# Patient Record
Sex: Male | Born: 2000 | Race: White | Hispanic: No | Marital: Single | Smoking: Never smoker
Health system: Southern US, Community
[De-identification: ages and names within clinical notes are randomized; demographics above are authoritative.]

## PROBLEM LIST (undated history)

## (undated) DIAGNOSIS — E669 Obesity, unspecified: Secondary | ICD-10-CM

## (undated) DIAGNOSIS — K219 Gastro-esophageal reflux disease without esophagitis: Secondary | ICD-10-CM

## (undated) DIAGNOSIS — I10 Essential (primary) hypertension: Secondary | ICD-10-CM

## (undated) HISTORY — DX: Essential (primary) hypertension: I10

## (undated) HISTORY — DX: Obesity, unspecified: E66.9

## (undated) HISTORY — DX: Gastro-esophageal reflux disease without esophagitis: K21.9

---
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
## 2021-02-09 ENCOUNTER — Other Ambulatory Visit: Payer: Self-pay

## 2021-02-09 ENCOUNTER — Ambulatory Visit (INDEPENDENT_AMBULATORY_CARE_PROVIDER_SITE_OTHER): Payer: Self-pay | Admitting: Pediatrics

## 2021-02-09 ENCOUNTER — Telehealth: Payer: Self-pay

## 2021-02-09 VITALS — Wt 280.0 lb

## 2021-02-09 DIAGNOSIS — H6692 Otitis media, unspecified, left ear: Secondary | ICD-10-CM

## 2021-02-09 MED ORDER — AMOXICILLIN-POT CLAVULANATE 875-125 MG PO TABS: 1.0000 | ORAL_TABLET | Freq: Two times a day (BID) | ORAL | 0 refills | Status: AC

## 2021-02-09 NOTE — Telephone Encounter (Signed)
Per patient having sharp ear pain, wanted to see if yall would approve a double booking, patient has not signed a release since last well was canceled. I advised patient that he will have to find another primary care.

## 2021-02-09 NOTE — Progress Notes (Signed)
Subjective:     James Sandoval is a 20 y.o. male who presents with ear pain and possible ear infection. Symptoms include: left ear pain. Onset of symptoms was 4 days ago, and have been unchanged since that time. Associated symptoms include: headache.  Patient denies: chills, fever , productive cough and sore throat. He is drinking plenty of fluids.  The following portions of the patient's history were reviewed and updated as appropriate: allergies, current medications, past medical history, past social history and problem list.  Review of Systems Pertinent items are noted in HPI.   Objective:    Wt 280 lb (127 kg)  General:  alert and cooperative  Right Ear: diminished mobility  Left Ear: red, inflamed and bulging   Mouth:  lips, mucosa, and tongue normal; teeth and gums normal     Assessment:    Left acute otitis media   Plan:    Treatment: Augmentin. OTC analgesia as needed. Fluids, rest, avoid carbonated/alcoholic and caffeinated beverages.  Follow up in only as needed  in the future if not improving.

## 2021-02-09 NOTE — Telephone Encounter (Signed)
I will see him 

## 2021-05-25 ENCOUNTER — Encounter: Payer: Self-pay | Admitting: Pediatrics

## 2021-06-20 ENCOUNTER — Other Ambulatory Visit: Payer: Self-pay

## 2021-06-20 ENCOUNTER — Emergency Department (HOSPITAL_COMMUNITY)
Admission: EM | Admit: 2021-06-20 | Discharge: 2021-06-20 | Disposition: A | Discharge status: Home / Self Care | Payer: 59 | Attending: Emergency Medicine | Admitting: Emergency Medicine

## 2021-06-20 ENCOUNTER — Encounter (HOSPITAL_COMMUNITY): Payer: Self-pay | Admitting: *Deleted

## 2021-06-20 DIAGNOSIS — J45901 Unspecified asthma with (acute) exacerbation: Secondary | ICD-10-CM | POA: Insufficient documentation

## 2021-06-20 DIAGNOSIS — K0889 Other specified disorders of teeth and supporting structures: Secondary | ICD-10-CM | POA: Insufficient documentation

## 2021-06-20 MED ORDER — PENICILLIN V POTASSIUM 500 MG PO TABS: 500.0000 mg | ORAL_TABLET | Freq: Four times a day (QID) | ORAL | 0 refills | Status: DC

## 2021-06-20 MED ORDER — PENICILLIN V POTASSIUM 500 MG PO TABS: 500.0000 mg | ORAL_TABLET | Freq: Four times a day (QID) | ORAL | 0 refills | Status: AC

## 2021-06-20 NOTE — ED Triage Notes (Signed)
Pt with right upper dental pain since yesterday.

## 2021-06-20 NOTE — ED Provider Notes (Signed)
[facility] EMERGENCY DEPARTMENT Provider Note   CSN: [id number] Arrival date & time: 06/20/21  1641     History Chief Complaint  Patient presents with   Dental Pain    James Sandoval is a 20 y.o. male.  HPI  Patient with significant medical history of GERD, obesity presents to the emergency department chief complaint of dental pain.  Patient states that started approximately 2 days ago, pain came on suddenly, pain in his right upper molar, states is a constant pain but will wax and wane in intensity, worsened with chewing, denies increased sensitivity, has no recent trauma to the area, states he has not seen a dentist in a long time.  He does note that he has a filling in that tooth with fell out and thinks this is what is causing him his pain.  He denies trismus, torticollis, denies fevers, chills, is not immunocompromise, has no other complaints this time.  He does not endorse tongue or throat swelling, difficulty swallowing, difficulty breathing or chest pain.   Past Medical History:  Diagnosis Date   Asthma    BP (high blood pressure) 03/23/2016   GERD (gastroesophageal reflux disease) 11/27/2013   Obesity, unspecified 05/28/2013    Patient Active Problem List   Diagnosis Date Noted   BP (high blood pressure) 03/23/2016   BMI, pediatric > 99% for age 49/26/2017   AN (acanthosis nigricans) 03/10/2016   Elevated blood pressure 03/10/2016   GERD (gastroesophageal reflux disease) 11/27/2013   Asthma with acute exacerbation 02/23/2013   Seasonal allergies 02/23/2013   Dysfluency 09/23/2011    History reviewed. No pertinent surgical history.     Family History  Problem Relation Age of Onset   Diabetes Other    Hypertension Mother    Hypertension Maternal Grandmother    Hypertension Maternal Grandfather    Diabetes Maternal Grandfather     Social History   Tobacco Use   Smoking status: Never   Smokeless tobacco: Never  Substance Use Topics   Alcohol use: No    Drug use: No    Home Medications Prior to Admission medications   Medication Sig Start Date End Date Taking? Authorizing Provider  penicillin v potassium (VEETID) 500 MG tablet Take 1 tablet (500 mg total) by mouth 4 (four) times daily for 7 days. 06/20/21 06/27/21 Yes Carroll Sage, PA-C  Cetirizine HCl (ZYRTEC PO) Take 1 tablet by mouth daily as needed. For allergy symptoms    [provider]  naproxen sodium (ANAPROX) 220 MG tablet Take by mouth.    [provider]    Allergies    Patient has no known allergies.  Review of Systems   Review of Systems  Constitutional:  Negative for chills and fever.  HENT:  Positive for dental problem. Negative for congestion, drooling, ear discharge, facial swelling, sore throat and trouble swallowing.   Respiratory:  Negative for shortness of breath.   Cardiovascular:  Negative for chest pain.  Gastrointestinal:  Negative for abdominal pain.  Musculoskeletal:  Negative for back pain.  Skin:  Negative for rash.  Neurological:  Negative for headaches.   Physical Exam Updated Vital Signs BP (!) 164/95 (BP Location: Right Arm)   Pulse 83   Temp 98.4 F (36.9 C) (Oral)   Resp 16   Ht 5' 10.5" (1.791 m)   Wt 123.4 kg   SpO2 99%   BMI 38.48 kg/m   Physical Exam Vitals and nursing note reviewed.  Constitutional:  General: He is not in acute distress.    Appearance: He is not ill-appearing.  HENT:     Head: Normocephalic and atraumatic.     Nose: No congestion.     Mouth/Throat:     Comments: Oropharynx is visualized tongue uvula are both midline, controlling oral secretions, his right upper molar has a dental cavity, no surrounding erythema or edema noted within the gumline, no fluctuant indurations present.  No trismus or torticollis. Eyes:     Extraocular Movements: Extraocular movements intact.     Conjunctiva/sclera: Conjunctivae normal.  Cardiovascular:     Rate and Rhythm: Normal rate and regular rhythm.   Pulmonary:     Effort: Pulmonary effort is normal.  Skin:    General: Skin is warm and dry.  Neurological:     Mental Status: He is alert.  Psychiatric:        Mood and Affect: Mood normal.    ED Results / Procedures / Treatments   Labs (all labs ordered are listed, but only abnormal results are displayed) Labs Reviewed - No data to display  EKG None  Radiology No results found.  Procedures Procedures   Medications Ordered in ED Medications - No data to display  ED Course  I have reviewed the triage vital signs and the nursing notes.  Pertinent labs & imaging results that were available during my care of the patient were reviewed by me and considered in my medical decision making (see chart for details).    MDM Rules/Calculators/A&P                          Initial impression-presents with a dental cavity.  He is alert, does not appear to be in acute stress, vital signs reassuring.  Work-up-due to well-appearing patient, benign physical exam, further lab or imaging not want at this time.  Rule out- I have low suspicion for peritonsillar abscess, retropharyngeal abscess, or Ludwig angina as oropharynx was visualized tongue and uvula were both midline, there is no exudates, erythema or edema noted in the posterior pillars or on/ around tonsils.  Low suspicion for an abscess as gumline were palpated no fluctuance or induration felt.  Low suspicion for periorbital or orbital cellulitis as patient face had no erythematous, patient EOMs were intact, he had no pain with eye movement.   Plan-  Dental pain-suspect dental cavity will start him on antibiotics, have him follow-up with a dentist for further evaluation.  Vital signs have remained stable, no indication for hospital admission. Patient given at home care as well strict return precautions.  Patient verbalized that they understood agreed to said plan.  Final Clinical Impression(s) / ED Diagnoses Final diagnoses:   Pain, dental    Rx / DC Orders ED Discharge Orders          Ordered    penicillin v potassium (VEETID) 500 MG tablet  4 times daily        06/20/21 1823             Barnie Del 06/20/21 Erling Conte, MD 06/21/21 [id number]

## 2021-06-20 NOTE — Discharge Instructions (Addendum)
Suspect to have a dental cavity, started on antibiotics please take as prescribed.  Recommend over-the-counter pain medications  Please follow-up with dentist for further evaluation.  Come back to the emergency department if you develop chest pain, shortness of breath, severe abdominal pain, uncontrolled nausea, vomiting, diarrhea.

## 2021-10-01 ENCOUNTER — Telehealth: Payer: Self-pay | Admitting: Internal Medicine

## 2021-10-01 ENCOUNTER — Encounter (HOSPITAL_COMMUNITY): Payer: Self-pay | Admitting: *Deleted

## 2021-10-01 ENCOUNTER — Emergency Department (HOSPITAL_COMMUNITY)
Admission: EM | Admit: 2021-10-01 | Discharge: 2021-10-01 | Disposition: A | Discharge status: Home / Self Care | Payer: 59 | Attending: Emergency Medicine | Admitting: Emergency Medicine

## 2021-10-01 DIAGNOSIS — R000 Tachycardia, unspecified: Secondary | ICD-10-CM | POA: Insufficient documentation

## 2021-10-01 DIAGNOSIS — K529 Noninfective gastroenteritis and colitis, unspecified: Secondary | ICD-10-CM | POA: Insufficient documentation

## 2021-10-01 DIAGNOSIS — J45901 Unspecified asthma with (acute) exacerbation: Secondary | ICD-10-CM | POA: Insufficient documentation

## 2021-10-01 DIAGNOSIS — R1110 Vomiting, unspecified: Secondary | ICD-10-CM | POA: Diagnosis present

## 2021-10-01 DIAGNOSIS — E876 Hypokalemia: Secondary | ICD-10-CM | POA: Insufficient documentation

## 2021-10-01 LAB — CBC WITH DIFFERENTIAL/PLATELET
Abs Immature Granulocytes: 0.11 10*3/uL — ABNORMAL HIGH (ref 0.00–0.07)
Basophils Absolute: 0.1 10*3/uL (ref 0.0–0.1)
Basophils Relative: 1 %
Eosinophils Absolute: 0.6 10*3/uL — ABNORMAL HIGH (ref 0.0–0.5)
Eosinophils Relative: 5 %
HCT: 46.1 % (ref 39.0–52.0)
Hemoglobin: 15.1 g/dL (ref 13.0–17.0)
Immature Granulocytes: 1 %
Lymphocytes Relative: 15 %
Lymphs Abs: 1.7 10*3/uL (ref 0.7–4.0)
MCH: 27.3 pg (ref 26.0–34.0)
MCHC: 32.8 g/dL (ref 30.0–36.0)
MCV: 83.4 fL (ref 80.0–100.0)
Monocytes Absolute: 2 10*3/uL — ABNORMAL HIGH (ref 0.1–1.0)
Monocytes Relative: 18 %
Neutro Abs: 6.7 10*3/uL (ref 1.7–7.7)
Neutrophils Relative %: 60 %
Platelets: 310 10*3/uL (ref 150–400)
RBC: 5.53 MIL/uL (ref 4.22–5.81)
RDW: 14.6 % (ref 11.5–15.5)
WBC: 11.2 10*3/uL — ABNORMAL HIGH (ref 4.0–10.5)
nRBC: 0 % (ref 0.0–0.2)

## 2021-10-01 LAB — COMPREHENSIVE METABOLIC PANEL
ALT: 17 U/L (ref 0–44)
AST: 15 U/L (ref 15–41)
Albumin: 4.2 g/dL (ref 3.5–5.0)
Alkaline Phosphatase: 66 U/L (ref 38–126)
Anion gap: 12 (ref 5–15)
BUN: 5 mg/dL — ABNORMAL LOW (ref 6–20)
CO2: 25 mmol/L (ref 22–32)
Calcium: 9 mg/dL (ref 8.9–10.3)
Chloride: 99 mmol/L (ref 98–111)
Creatinine, Ser: 0.75 mg/dL (ref 0.61–1.24)
GFR, Estimated: >60 mL/min (ref >60)
Glucose, Bld: 97 mg/dL (ref 70–99)
Potassium: 3.3 mmol/L — ABNORMAL LOW (ref 3.5–5.1)
Sodium: 136 mmol/L (ref 135–145)
Total Bilirubin: 0.8 mg/dL (ref 0.3–1.2)
Total Protein: 7.7 g/dL (ref 6.5–8.1)

## 2021-10-01 LAB — LIPASE, BLOOD: Lipase: 19 U/L (ref 11–51)

## 2021-10-01 MED ORDER — POTASSIUM CHLORIDE CRYS ER 20 MEQ PO TBCR
40.0000 meq | EXTENDED_RELEASE_TABLET | Freq: Once | ORAL | Status: AC
  Administered 2021-10-01: 19:00:00 40 meq via ORAL
  Filled 2021-10-01: qty 2

## 2021-10-01 MED ORDER — ONDANSETRON 4 MG PO TBDP: 4.0000 mg | ORAL_TABLET | Freq: Three times a day (TID) | ORAL | 0 refills | Status: AC | PRN

## 2021-10-01 MED ORDER — LACTATED RINGERS IV BOLUS
1000.0000 mL | Freq: Once | INTRAVENOUS | Status: AC
  Administered 2021-10-01: 18:00:00 1000 mL via INTRAVENOUS

## 2021-10-01 MED ORDER — POTASSIUM CHLORIDE CRYS ER 20 MEQ PO TBCR: 20.0000 meq | EXTENDED_RELEASE_TABLET | Freq: Every day | ORAL | 0 refills | Status: AC

## 2021-10-01 MED ORDER — ACETAMINOPHEN 500 MG PO TABS
1000.0000 mg | ORAL_TABLET | Freq: Once | ORAL | Status: AC
  Administered 2021-10-01: 18:00:00 1000 mg via ORAL
  Filled 2021-10-01: qty 2

## 2021-10-01 MED ORDER — LOPERAMIDE HCL 2 MG PO CAPS: 2.0000 mg | ORAL_CAPSULE | Freq: Four times a day (QID) | ORAL | 0 refills | Status: AC | PRN

## 2021-10-01 NOTE — Discharge Instructions (Addendum)
If you develop worsening, continued, or recurrent abdominal pain, uncontrolled vomiting, fever, chest or back pain, if your bloody diarrhea worsens, or any other new/concerning symptoms then return to the ER for evaluation.   The gastroenterologist listed will call you for an appointment.

## 2021-10-01 NOTE — Telephone Encounter (Signed)
ER contact me about this patient.  Can we get an urgent appointment in the clinic with either an app or myself for rectal bleeding and diarrhea?  Okay to use one of my urgent spots if needed.  Thank you

## 2021-10-01 NOTE — ED Triage Notes (Signed)
Vomiting for a week intermittent, also c/o abdominal pain

## 2021-10-01 NOTE — ED Provider Notes (Signed)
[facility] EMERGENCY DEPARTMENT Provider Note   CSN: [id number] Arrival date & time: 10/01/21  1613     History Chief Complaint  Patient presents with   Emesis    James Sandoval is a 20 y.o. male.  HPI 20 year old male presents with vomiting, diarrhea, and abdominal pain.  He has been dealing with symptoms for about a week.  He has abdominal pain about an hour after eating that is diffuse across his lower abdomen.  He is also been having some vomiting an hour after eating that is just spit.  Started off by having diarrhea but now this is changed into "bloody" stools.  He has been drinking a lot of red drinks and wondering if that is doing it as it has done this in the past.  He is not sure if is really blood or just red from diet.  Right now he has no symptoms.  He thinks he has been having a fever but has not checked.  No recent traveling anywhere. No sick contacts.  Past Medical History:  Diagnosis Date   Asthma    BP (high blood pressure) 03/23/2016   GERD (gastroesophageal reflux disease) 11/27/2013   Obesity, unspecified 05/28/2013    Patient Active Problem List   Diagnosis Date Noted   BP (high blood pressure) 03/23/2016   BMI, pediatric > 99% for age 67/26/2017   AN (acanthosis nigricans) 03/10/2016   Elevated blood pressure 03/10/2016   GERD (gastroesophageal reflux disease) 11/27/2013   Asthma with acute exacerbation 02/23/2013   Seasonal allergies 02/23/2013   Dysfluency 09/23/2011    History reviewed. No pertinent surgical history.     Family History  Problem Relation Age of Onset   Diabetes Other    Hypertension Mother    Hypertension Maternal Grandmother    Hypertension Maternal Grandfather    Diabetes Maternal Grandfather     Social History   Tobacco Use   Smoking status: Never   Smokeless tobacco: Never  Substance Use Topics   Alcohol use: No   Drug use: No    Home Medications Prior to Admission medications   Medication Sig Start Date  End Date Taking? Authorizing Provider  loperamide (IMODIUM) 2 MG capsule Take 1 capsule (2 mg total) by mouth 4 (four) times daily as needed for diarrhea or loose stools. 10/01/21  Yes Pricilla Loveless, MD  ondansetron (ZOFRAN ODT) 4 MG disintegrating tablet Take 1 tablet (4 mg total) by mouth every 8 (eight) hours as needed for nausea or vomiting. 10/01/21  Yes Pricilla Loveless, MD  potassium chloride SA (KLOR-CON) 20 MEQ tablet Take 1 tablet (20 mEq total) by mouth daily. 10/01/21  Yes Pricilla Loveless, MD  Cetirizine HCl (ZYRTEC PO) Take 1 tablet by mouth daily as needed. For allergy symptoms    [provider]  naproxen sodium (ANAPROX) 220 MG tablet Take by mouth.    [provider]    Allergies    Patient has no known allergies.  Review of Systems   Review of Systems  Constitutional:  Positive for fever.  Gastrointestinal:  Positive for abdominal pain, blood in stool, diarrhea and vomiting.  All other systems reviewed and are negative.  Physical Exam Updated Vital Signs BP (!) 144/85   Pulse 93   Temp 99.9 F (37.7 C)   Resp 19   SpO2 99%   Physical Exam Vitals and nursing note reviewed.  Constitutional:      General: He is not in acute distress.  Appearance: He is well-developed. He is obese. He is not ill-appearing or diaphoretic.  HENT:     Head: Normocephalic and atraumatic.     Right Ear: External ear normal.     Left Ear: External ear normal.     Nose: Nose normal.  Eyes:     General:        Right eye: No discharge.        Left eye: No discharge.  Cardiovascular:     Rate and Rhythm: Regular rhythm. Tachycardia present.     Heart sounds: Normal heart sounds.  Pulmonary:     Effort: Pulmonary effort is normal.     Breath sounds: Normal breath sounds.  Abdominal:     Palpations: Abdomen is soft.     Tenderness: There is no abdominal tenderness.  Musculoskeletal:     Cervical back: Neck supple.  Skin:    General: Skin is warm and dry.   Neurological:     Mental Status: He is alert.  Psychiatric:        Mood and Affect: Mood is not anxious.    ED Results / Procedures / Treatments   Labs (all labs ordered are listed, but only abnormal results are displayed) Labs Reviewed  COMPREHENSIVE METABOLIC PANEL - Abnormal; Notable for the following components:      Result Value   Potassium 3.3 (*)    BUN 5 (*)    All other components within normal limits  CBC WITH DIFFERENTIAL/PLATELET - Abnormal; Notable for the following components:   WBC 11.2 (*)    Monocytes Absolute 2.0 (*)    Eosinophils Absolute 0.6 (*)    Abs Immature Granulocytes 0.11 (*)    All other components within normal limits  GASTROINTESTINAL PANEL BY PCR, STOOL (REPLACES STOOL CULTURE)  LIPASE, BLOOD  POC OCCULT BLOOD, ED    EKG None  Radiology No results found.  Procedures Procedures   Medications Ordered in ED Medications  potassium chloride SA (KLOR-CON) CR tablet 40 mEq (has no administration in time range)  lactated ringers bolus 1,000 mL (0 mLs Intravenous Stopped 10/01/21 1847)  acetaminophen (TYLENOL) tablet 1,000 mg (1,000 mg Oral Given 10/01/21 1801)    ED Course  I have reviewed the triage vital signs and the nursing notes.  Pertinent labs & imaging results that were available during my care of the patient were reviewed by me and considered in my medical decision making (see chart for details).    MDM Rules/Calculators/A&P                           Patient's abdominal exam is benign.  On rectal exam there is a slight amount of pink that is Hemoccult positive.  Discussed with Dr. Marletta Lor.  Given otherwise well appearance would hold off on antibiotics in case this is enterogenic E. coli.  Would still be okay to do Imodium.  Given has had a prior history of having some blood or red in his stools, Dr. Marletta Lor will follow-up as an outpatient.  Otherwise, patient's abdominal exam is benign and I do not think a CT will be helpful.   Discussed supportive care.  Will be given return precautions. Final Clinical Impression(s) / ED Diagnoses Final diagnoses:  Gastroenteritis  Hypokalemia    Rx / DC Orders ED Discharge Orders          Ordered    ondansetron (ZOFRAN ODT) 4 MG disintegrating tablet  Every 8 hours PRN  10/01/21 1911    potassium chloride SA (KLOR-CON) 20 MEQ tablet  Daily        10/01/21 1911    loperamide (IMODIUM) 2 MG capsule  4 times daily PRN        10/01/21 1911             Pricilla Loveless, MD 10/01/21 1914

## 2021-10-02 LAB — POC OCCULT BLOOD, ED: Fecal Occult Bld: POSITIVE — AB

## 2021-10-05 ENCOUNTER — Other Ambulatory Visit: Payer: Self-pay

## 2021-10-05 ENCOUNTER — Encounter: Payer: Self-pay | Admitting: Gastroenterology

## 2021-10-05 ENCOUNTER — Ambulatory Visit: Payer: 59 | Admitting: Gastroenterology

## 2021-10-05 DIAGNOSIS — R197 Diarrhea, unspecified: Secondary | ICD-10-CM

## 2021-10-05 NOTE — Patient Instructions (Signed)
Please collect stool as soon as you can for infection. We will be in touch with results as available. If you continue to have diarrhea or blood in the stool, please call us at [phone]. You will need to have a colonoscopy if this happens.  Complete potassium.

## 2021-10-05 NOTE — Progress Notes (Signed)
Primary Care Physician:  Pcp, No  Primary Gastroenterologist:  Elon Alas. Abbey Chatters, DO   Chief Complaint  Patient presents with   Rectal Bleeding    Seen in ED   Diarrhea     HPI:  James Sandoval is a 20 y.o. male here for further evaluation of abdominal pain, vomiting, diarrhea, rectal bleeding.  Patient seen in the ED on November 17 with the above symptoms.  Symptoms had been occurring for about 1 week.  Patient states symptoms started after eating out at fast food restaurant (chicken) earlier in the day.  Thought he may have food poisoning.  Mother and father also ate without any symptoms.  Developed lower abdominal cramping type pain associated with diarrhea.  He did have several episodes of dry heaves.  Unable to eat for about a week.  Eventually developed blood in the stool.  At first she thought it was from the red drinks he was drinking.  Felt weak and dehydrated so went to the ED on November 17.  In the ED visit, abdominal exam benign.  Rectal exam showed a slight amount of pink that was heme positive.  ED provider contacted Dr. Abbey Chatters who recommended collecting stool studies, holding off antibiotics following up this week for an office visit.  Labs showed potassium of 3.3, white blood cell count 11,200.  Patient states he did not collect stool studies.  He has 1 more dose of potassium.  Has not needed Zofran or Imodium.  Feels like stool frequency has improved significantly but stools remain "milkshake consistency".  Last stool last night was still bloody.  Noted dark blood and fresh blood.  He has not had a bowel movement this morning.  His lower abdominal pain has improved significantly.  He is now able to eat.  Denies any rectal pain.  States he feels so much better.  Generally his stools are pretty regular, daily or every other day.  Really denies any significant chronic diarrhea.  He has seen blood in the stools once in the past.  No family history of colon cancer,  IBD.   Current Outpatient Medications  Medication Sig Dispense Refill   loperamide (IMODIUM) 2 MG capsule Take 1 capsule (2 mg total) by mouth 4 (four) times daily as needed for diarrhea or loose stools. 12 capsule 0   ondansetron (ZOFRAN ODT) 4 MG disintegrating tablet Take 1 tablet (4 mg total) by mouth every 8 (eight) hours as needed for nausea or vomiting. 10 tablet 0   potassium chloride SA (KLOR-CON) 20 MEQ tablet Take 1 tablet (20 mEq total) by mouth daily. 3 tablet 0   No current facility-administered medications for this visit.    Allergies as of 10/05/2021   (No Known Allergies)    Past Medical History:  Diagnosis Date   Asthma    BP (high blood pressure) 03/23/2016   GERD (gastroesophageal reflux disease) 11/27/2013   Obesity, unspecified 05/28/2013    History reviewed. No pertinent surgical history.  Family History  Problem Relation Age of Onset   Hypertension Mother    Hypertension Maternal Grandmother    Hypertension Maternal Grandfather    Diabetes Maternal Grandfather    Diabetes Other    Colon cancer Neg Hx    Inflammatory bowel disease Neg Hx     Social History   Socioeconomic History   Marital status: Single    Spouse name: Not on file   Number of children: Not on file   Years of education: Not on  file   Highest education level: Not on file  Occupational History   Not on file  Tobacco Use   Smoking status: Never   Smokeless tobacco: Never  Substance and Sexual Activity   Alcohol use: No   Drug use: No   Sexual activity: Not on file  Other Topics Concern   Not on file  Social History Narrative   Lives with mother   Social Determinants of Health   Financial Resource Strain: Not on file  Food Insecurity: Not on file  Transportation Needs: Not on file  Physical Activity: Not on file  Stress: Not on file  Social Connections: Not on file  Intimate Partner Violence: Not on file      ROS:  General: Negative for anorexia, weight loss,  fever, chills, fatigue, weakness.  See HPI. Eyes: Negative for vision changes.  ENT: Negative for hoarseness, difficulty swallowing , nasal congestion. CV: Negative for chest pain, angina, palpitations, dyspnea on exertion, peripheral edema.  Respiratory: Negative for dyspnea at rest, dyspnea on exertion, cough, sputum, wheezing.  GI: See history of present illness. GU:  Negative for dysuria, hematuria, urinary incontinence, urinary frequency, nocturnal urination.  MS: Negative for joint pain, low back pain.  Derm: Negative for rash or itching.  Neuro: Negative for weakness, abnormal sensation, seizure, frequent headaches, memory loss, confusion.  Psych: Negative for anxiety, depression, suicidal ideation, hallucinations.  Endo: Negative for unusual weight change.  Heme: Negative for bruising or bleeding. Allergy: Negative for rash or hives.    Physical Examination:  BP 135/82   Pulse (!) 101   Temp (!) 97.5 F (36.4 C) (Temporal)   Ht 5' 10.5" (1.791 m)   Wt 267 lb 12.8 oz (121.5 kg)   BMI 37.88 kg/m    General: Well-nourished, well-developed in no acute distress.  Head: Normocephalic, atraumatic.   Eyes: Conjunctiva pink, no icterus. Mouth: masked Neck: Supple without thyromegaly, masses, or lymphadenopathy.  Lungs: Clear to auscultation bilaterally.  Heart: Regular rate and rhythm, no murmurs rubs or gallops.  Abdomen: Bowel sounds are normal, nontender, nondistended, no hepatosplenomegaly or masses, no abdominal bruits or    hernia , no rebound or guarding.   Rectal: not performed Extremities: No lower extremity edema. No clubbing or deformities.  Neuro: Alert and oriented x 4 , grossly normal neurologically.  Skin: Warm and dry, no rash or jaundice.   Psych: Alert and cooperative, normal mood and affect.  Labs: Lab Results  Component Value Date   CREATININE 0.75 10/01/2021   BUN 5 (L) 10/01/2021   NA 136 10/01/2021   K 3.3 (L) 10/01/2021   CL 99 10/01/2021   CO2  25 10/01/2021   Lab Results  Component Value Date   ALT 17 10/01/2021   AST 15 10/01/2021   ALKPHOS 66 10/01/2021   BILITOT 0.8 10/01/2021   Lab Results  Component Value Date   WBC 11.2 (H) 10/01/2021   HGB 15.1 10/01/2021   HCT 46.1 10/01/2021   MCV 83.4 10/01/2021   PLT 310 10/01/2021   Lab Results  Component Value Date   LIPASE 19 10/01/2021     Imaging Studies: No results found.   Assessment/Plan:  Acute onset nausea/vomiting, diarrhea, blood in the stool: Likely infectious process, possibly foodborne.  Patient denies any significant chronic symptoms.  Has seen blood in the stool once prior to recent illness.  Continues to improve.  Stool frequency better.  Still having loose bloody stools.  We will have him complete stool studies.  This may help determine if any additional work-up needed.  Hold off on antibiotics given he is better.  Complete potassium supplement.  If he continues to have loose stools or blood in the stools, he may require colonoscopy to rule out inflammatory bowel disease.  He will keep [place] posted.

## 2021-10-11 LAB — GI PROFILE, STOOL, PCR

## 2021-12-21 ENCOUNTER — Ambulatory Visit: Payer: 59 | Admitting: Nurse Practitioner
